# Patient Record
Sex: Male | Born: 1960 | Race: White | Hispanic: No | Marital: Married | Smoking: Never smoker
Health system: Southern US, Community
[De-identification: ages and names within clinical notes are randomized; demographics above are authoritative.]

## PROBLEM LIST (undated history)

## (undated) DIAGNOSIS — I10 Essential (primary) hypertension: Secondary | ICD-10-CM

## (undated) DIAGNOSIS — E119 Type 2 diabetes mellitus without complications: Secondary | ICD-10-CM

## (undated) DIAGNOSIS — Z87442 Personal history of urinary calculi: Secondary | ICD-10-CM

## (undated) DIAGNOSIS — M1990 Unspecified osteoarthritis, unspecified site: Secondary | ICD-10-CM

## (undated) HISTORY — PX: EYE SURGERY: SHX253

---

## 2015-04-28 ENCOUNTER — Other Ambulatory Visit (HOSPITAL_COMMUNITY): Payer: Self-pay | Admitting: Orthopaedic Surgery

## 2015-04-30 NOTE — Pre-Procedure Instructions (Addendum)
Rena Deanda  04/30/2015    Your procedure is scheduled on February 9.  Report to [facility] Admitting at 7:15 A.M.  Call this number if you have problems the morning of surgery:  [phone]   Remember:  Do not eat food or drink liquids after midnight.  Take these medicines the morning of surgery with A SIP OF WATER None    STOP Aspirin, Vitamin D, Fish Oil February 2   STOP/ Do not take Aspirin, Aleve, Naproxen, Advil, Ibuprofen, Motrin, Vitamins, Herbs, or Supplements starting February 2  How to Manage Your Diabetes Before Surgery   Why is it important to control my blood sugar before and after surgery?   Improving blood sugar levels before and after surgery helps healing and can limit problems.  A way of improving blood sugar control is eating a healthy diet by:  - Eating less sugar and carbohydrates  - Increasing activity/exercise  - Talk with your doctor about reaching your blood sugar goals  High blood sugars (greater than 180 mg/dL) can raise your risk of infections and slow down your recovery so you will need to focus on controlling your diabetes during the weeks before surgery.  Make sure that the doctor who takes care of your diabetes knows about your planned surgery including the date and location.  How do I manage my blood sugars before surgery?   Check your blood sugar at least 4 times a day, 2 days before surgery to make sure that they are not too high or low.   Check your blood sugar the morning of your surgery when you wake up and every 2 hours until you get to the Short-Stay unit.  If your blood sugar is less than 70 mg/dL, you will need to treat for low blood sugar by:  Treat a low blood sugar (less than 70 mg/dL) with 1/2 cup of clear juice (cranberry or apple), 4 glucose tablets, OR glucose gel.  Recheck blood sugar in 15 minutes after treatment (to make sure it is greater than 70 mg/dL).  If blood sugar is not greater than 70 mg/dL  on re-check, call [phone] for further instructions.   Report your blood sugar to the Short-Stay nurse when you get to Short-Stay.   What do I do about my diabetes medications?   Do not take oral diabetes medicines (pills) the morning of surgery.(glipizide)janumet)    Do not wear jewelry, make-up or nail polish.  Do not wear lotions, powders, or perfumes.  You may wear deodorant.  Do not shave 48 hours prior to surgery.  Men may shave face and neck.  Do not bring valuables to the hospital.  [facility] is not responsible for any belongings or valuables.  Contacts, dentures or bridgework may not be worn into surgery.  Leave your suitcase in the car.  After surgery it may be brought to your room.  For patients admitted to the hospital, discharge time will be determined by your treatment team.  Patients discharged the day of surgery will not be allowed to drive home.   [place] - Preparing for Surgery  Before surgery, you can play an important role.  Because skin is not sterile, your skin needs to be as free of germs as possible.  You can reduce the number of germs on you skin by washing with CHG (chlorahexidine gluconate) soap before surgery.  CHG is an antiseptic cleaner which kills germs and bonds with the skin to continue killing  germs even after washing.  Please DO NOT use if you have an allergy to CHG or antibacterial soaps.  If your skin becomes reddened/irritated stop using the CHG and inform your nurse when you arrive at Short Stay.  Do not shave (including legs and underarms) for at least 48 hours prior to the first CHG shower.  You may shave your face.  Please follow these instructions carefully:   1.  Shower with CHG Soap the night before surgery and the morning of Surgery.  2.  If you choose to wash your hair, wash your hair first as usual with your normal shampoo.  3.  After you shampoo, rinse your hair and body thoroughly to remove the shampoo.  4.  Use CHG as  you would any other liquid soap.  You can apply CHG directly to the skin and wash gently with scrungie or a clean washcloth.  5.  Apply the CHG Soap to your body ONLY FROM THE NECK DOWN.  Do not use on open wounds or open sores.  Avoid contact with your eyes, ears, mouth and genitals (private parts).  Wash genitals (private parts) with your normal soap.  6.  Wash thoroughly, paying special attention to the area where your surgery will be performed.  7.  Thoroughly rinse your body with warm water from the neck down.  8.  DO NOT shower/wash with your normal soap after using and rinsing off the CHG Soap.  9.  Pat yourself dry with a clean towel.            10.  Wear clean pajamas.            11.  Place clean sheets on your bed the night of your first shower and do not sleep with pets.  Day of Surgery  Do not apply any lotions the morning of surgery.  Please wear clean clothes to the hospital/surgery center.   Please read over the following fact sheets that you were given. Pain Booklet, Coughing and Deep Breathing, Total Joint Packet and Surgical Site Infection Prevention

## 2015-05-02 ENCOUNTER — Encounter (HOSPITAL_COMMUNITY)
Admission: RE | Admit: 2015-05-02 | Discharge: 2015-05-02 | Disposition: A | Discharge status: Home / Self Care | Payer: Managed Care, Other (non HMO) | Source: Ambulatory Visit | Attending: Orthopaedic Surgery | Admitting: Orthopaedic Surgery

## 2015-05-02 ENCOUNTER — Encounter (HOSPITAL_COMMUNITY): Payer: Self-pay

## 2015-05-02 DIAGNOSIS — E119 Type 2 diabetes mellitus without complications: Secondary | ICD-10-CM | POA: Diagnosis not present

## 2015-05-02 DIAGNOSIS — I10 Essential (primary) hypertension: Secondary | ICD-10-CM | POA: Diagnosis not present

## 2015-05-02 DIAGNOSIS — Z7984 Long term (current) use of oral hypoglycemic drugs: Secondary | ICD-10-CM | POA: Insufficient documentation

## 2015-05-02 DIAGNOSIS — M1612 Unilateral primary osteoarthritis, left hip: Secondary | ICD-10-CM | POA: Diagnosis not present

## 2015-05-02 DIAGNOSIS — Z01812 Encounter for preprocedural laboratory examination: Secondary | ICD-10-CM | POA: Diagnosis not present

## 2015-05-02 DIAGNOSIS — I4510 Unspecified right bundle-branch block: Secondary | ICD-10-CM | POA: Insufficient documentation

## 2015-05-02 DIAGNOSIS — Z01818 Encounter for other preprocedural examination: Secondary | ICD-10-CM | POA: Insufficient documentation

## 2015-05-02 DIAGNOSIS — Z79899 Other long term (current) drug therapy: Secondary | ICD-10-CM | POA: Insufficient documentation

## 2015-05-02 HISTORY — DX: Essential (primary) hypertension: I10

## 2015-05-02 HISTORY — DX: Type 2 diabetes mellitus without complications: E11.9

## 2015-05-02 HISTORY — DX: Personal history of urinary calculi: Z87.442

## 2015-05-02 LAB — BASIC METABOLIC PANEL
ANION GAP: 10 (ref 5–15)
BUN: 18 mg/dL (ref 6–20)
CALCIUM: 9.3 mg/dL (ref 8.9–10.3)
CHLORIDE: 105 mmol/L (ref 101–111)
CO2: 24 mmol/L (ref 22–32)
CREATININE: 1.01 mg/dL (ref 0.61–1.24)
GFR calc Af Amer: >60 mL/min (ref >60)
GFR calc non Af Amer: >60 mL/min (ref >60)
GLUCOSE: 180 mg/dL — AB (ref 65–99)
Potassium: 4.3 mmol/L (ref 3.5–5.1)
Sodium: 139 mmol/L (ref 135–145)

## 2015-05-02 LAB — CBC
HCT: 44.9 % (ref 39.0–52.0)
HEMOGLOBIN: 16.1 g/dL (ref 13.0–17.0)
MCH: 31.1 pg (ref 26.0–34.0)
MCHC: 35.9 g/dL (ref 30.0–36.0)
MCV: 86.8 fL (ref 78.0–100.0)
Platelets: 172 10*3/uL (ref 150–400)
RBC: 5.17 MIL/uL (ref 4.22–5.81)
RDW: 12.7 % (ref 11.5–15.5)
WBC: 7.2 10*3/uL (ref 4.0–10.5)

## 2015-05-02 LAB — SURGICAL PCR SCREEN
MRSA, PCR: NEGATIVE
Staphylococcus aureus: NEGATIVE

## 2015-05-02 LAB — GLUCOSE, CAPILLARY: GLUCOSE-CAPILLARY: 160 mg/dL — AB (ref 65–99)

## 2015-05-03 LAB — HEMOGLOBIN A1C
HEMOGLOBIN A1C: 7 % — AB (ref 4.8–5.6)
Mean Plasma Glucose: 154 mg/dL

## 2015-05-04 NOTE — Progress Notes (Signed)
Anesthesia Chart Review:  Pt is a 55 year old male scheduled for L total hip arthroplasty anterior approach on 05/12/2015 with Dr. Maureen Ralphs.   PCP is Dr. Lamount Cranker.  PMH includes:  HTN, DM, heart murmur. Never smoker. BMI 24  Medications include: ASA, glipizide, janumet, lisinopril-hctz, potassium.   Preoperative labs reviewed.  Glucose 180, hgbA1c 7.0  EKG 05/02/15: NSR. Incomplete RBBB. Minimal voltage criteria for LVH, may be normal variant. Possible Anterior infarct, age undetermined. No old tracing available for comparison  Reviewed case with Dr. Maple Hudson.   If no changes, I anticipate pt can proceed with surgery as scheduled.   Rica Mast, FNP-BC [facility]/Anesthesiology Phone: [phone] 05/04/2015 12:23 PM

## 2015-05-11 MED ORDER — TRANEXAMIC ACID 1000 MG/10ML IV SOLN
1000.0000 mg | INTRAVENOUS | Status: AC
  Administered 2015-05-12: 1000 mg via INTRAVENOUS
  Filled 2015-05-11: qty 10

## 2015-05-11 MED ORDER — CEFAZOLIN SODIUM-DEXTROSE 2-3 GM-% IV SOLR
2.0000 g | INTRAVENOUS | Status: AC
  Administered 2015-05-12: 2 g via INTRAVENOUS
  Filled 2015-05-11: qty 50

## 2015-05-12 ENCOUNTER — Inpatient Hospital Stay (HOSPITAL_COMMUNITY): Payer: Managed Care, Other (non HMO)

## 2015-05-12 ENCOUNTER — Encounter (HOSPITAL_COMMUNITY)
Admission: RE | Disposition: A | Discharge status: Home Health | Payer: Self-pay | Source: Ambulatory Visit | Attending: Orthopaedic Surgery

## 2015-05-12 ENCOUNTER — Inpatient Hospital Stay (HOSPITAL_COMMUNITY): Payer: Managed Care, Other (non HMO) | Admitting: Emergency Medicine

## 2015-05-12 ENCOUNTER — Encounter (HOSPITAL_COMMUNITY): Payer: Self-pay | Admitting: General Practice

## 2015-05-12 ENCOUNTER — Inpatient Hospital Stay (HOSPITAL_COMMUNITY)
Admission: RE | Admit: 2015-05-12 | Discharge: 2015-05-14 | DRG: 470 | Disposition: A | Discharge status: Home Health | Payer: Managed Care, Other (non HMO) | Source: Ambulatory Visit | Attending: Orthopaedic Surgery | Admitting: Orthopaedic Surgery

## 2015-05-12 ENCOUNTER — Inpatient Hospital Stay (HOSPITAL_COMMUNITY): Payer: Managed Care, Other (non HMO) | Admitting: Certified Registered"

## 2015-05-12 DIAGNOSIS — Z7984 Long term (current) use of oral hypoglycemic drugs: Secondary | ICD-10-CM | POA: Diagnosis not present

## 2015-05-12 DIAGNOSIS — E119 Type 2 diabetes mellitus without complications: Secondary | ICD-10-CM | POA: Diagnosis present

## 2015-05-12 DIAGNOSIS — Z419 Encounter for procedure for purposes other than remedying health state, unspecified: Secondary | ICD-10-CM

## 2015-05-12 DIAGNOSIS — Z791 Long term (current) use of non-steroidal anti-inflammatories (NSAID): Secondary | ICD-10-CM

## 2015-05-12 DIAGNOSIS — Z96642 Presence of left artificial hip joint: Secondary | ICD-10-CM

## 2015-05-12 DIAGNOSIS — I10 Essential (primary) hypertension: Secondary | ICD-10-CM | POA: Diagnosis present

## 2015-05-12 DIAGNOSIS — M25552 Pain in left hip: Secondary | ICD-10-CM | POA: Diagnosis present

## 2015-05-12 DIAGNOSIS — M1612 Unilateral primary osteoarthritis, left hip: Secondary | ICD-10-CM | POA: Diagnosis present

## 2015-05-12 HISTORY — DX: Unspecified osteoarthritis, unspecified site: M19.90

## 2015-05-12 HISTORY — PX: TOTAL HIP ARTHROPLASTY: SHX124

## 2015-05-12 LAB — GLUCOSE, CAPILLARY
Glucose-Capillary: 150 mg/dL — ABNORMAL HIGH (ref 65–99)
Glucose-Capillary: 101 mg/dL — ABNORMAL HIGH (ref 65–99)
Glucose-Capillary: 162 mg/dL — ABNORMAL HIGH (ref 65–99)
Glucose-Capillary: 173 mg/dL — ABNORMAL HIGH (ref 65–99)
Glucose-Capillary: 268 mg/dL — ABNORMAL HIGH (ref 65–99)

## 2015-05-12 SURGERY — ARTHROPLASTY, HIP, TOTAL, ANTERIOR APPROACH
Anesthesia: Monitor Anesthesia Care | Site: Hip | Laterality: Left

## 2015-05-12 MED ORDER — POTASSIUM CHLORIDE CRYS ER 20 MEQ PO TBCR
20.0000 meq | EXTENDED_RELEASE_TABLET | Freq: Two times a day (BID) | ORAL | Status: DC
Start: 2015-05-12 — End: 2015-05-14
  Administered 2015-05-12 – 2015-05-14 (×4): 20 meq via ORAL
  Filled 2015-05-12 (×5): qty 1

## 2015-05-12 MED ORDER — METHOCARBAMOL 1000 MG/10ML IJ SOLN
500.0000 mg | Freq: Four times a day (QID) | INTRAVENOUS | Status: DC | PRN
  Filled 2015-05-12: qty 5

## 2015-05-12 MED ORDER — LIDOCAINE HCL (CARDIAC) 20 MG/ML IV SOLN
INTRAVENOUS | Status: AC
  Filled 2015-05-12: qty 5

## 2015-05-12 MED ORDER — BUPIVACAINE IN DEXTROSE 0.75-8.25 % IT SOLN
INTRATHECAL | Status: DC | PRN
  Administered 2015-05-12: 13.5 mg via INTRATHECAL

## 2015-05-12 MED ORDER — ASPIRIN EC 325 MG PO TBEC
325.0000 mg | DELAYED_RELEASE_TABLET | Freq: Two times a day (BID) | ORAL | Status: DC
  Administered 2015-05-12 – 2015-05-14 (×4): 325 mg via ORAL
  Filled 2015-05-12 (×4): qty 1

## 2015-05-12 MED ORDER — METOCLOPRAMIDE HCL 5 MG PO TABS: 5.0000 mg | ORAL_TABLET | Freq: Three times a day (TID) | ORAL | Status: DC | PRN

## 2015-05-12 MED ORDER — METFORMIN HCL 500 MG PO TABS
1000.0000 mg | ORAL_TABLET | Freq: Two times a day (BID) | ORAL | Status: DC
  Administered 2015-05-12 – 2015-05-14 (×4): 1000 mg via ORAL
  Filled 2015-05-12 (×4): qty 2

## 2015-05-12 MED ORDER — HYDROMORPHONE HCL 1 MG/ML IJ SOLN
0.2500 mg | INTRAMUSCULAR | Status: DC | PRN
  Administered 2015-05-12: 0.25 mg via INTRAVENOUS

## 2015-05-12 MED ORDER — PROPOFOL 10 MG/ML IV BOLUS
INTRAVENOUS | Status: AC
  Filled 2015-05-12: qty 20

## 2015-05-12 MED ORDER — DOCUSATE SODIUM 100 MG PO CAPS
100.0000 mg | ORAL_CAPSULE | Freq: Two times a day (BID) | ORAL | Status: DC
Start: 2015-05-12 — End: 2015-05-14
  Administered 2015-05-12 – 2015-05-14 (×5): 100 mg via ORAL
  Filled 2015-05-12 (×5): qty 1

## 2015-05-12 MED ORDER — MIDAZOLAM HCL 2 MG/2ML IJ SOLN
INTRAMUSCULAR | Status: AC
  Filled 2015-05-12: qty 2

## 2015-05-12 MED ORDER — OXYCODONE HCL 5 MG PO TABS
5.0000 mg | ORAL_TABLET | ORAL | Status: DC | PRN
  Administered 2015-05-12 – 2015-05-14 (×10): 10 mg via ORAL
  Filled 2015-05-12 (×10): qty 2

## 2015-05-12 MED ORDER — ONDANSETRON HCL 4 MG/2ML IJ SOLN: 4.0000 mg | Freq: Four times a day (QID) | INTRAMUSCULAR | Status: DC | PRN

## 2015-05-12 MED ORDER — 0.9 % SODIUM CHLORIDE (POUR BTL) OPTIME
TOPICAL | Status: DC | PRN
  Administered 2015-05-12: 1000 mL

## 2015-05-12 MED ORDER — PHENYLEPHRINE HCL 10 MG/ML IJ SOLN
INTRAMUSCULAR | Status: DC | PRN
  Administered 2015-05-12: 40 ug via INTRAVENOUS
  Administered 2015-05-12: 80 ug via INTRAVENOUS

## 2015-05-12 MED ORDER — ALUM & MAG HYDROXIDE-SIMETH 200-200-20 MG/5ML PO SUSP: 30.0000 mL | ORAL | Status: DC | PRN

## 2015-05-12 MED ORDER — FENTANYL CITRATE (PF) 250 MCG/5ML IJ SOLN
INTRAMUSCULAR | Status: DC | PRN
  Administered 2015-05-12: 50 ug via INTRAVENOUS

## 2015-05-12 MED ORDER — SODIUM CHLORIDE 0.9 % IR SOLN
Status: DC | PRN
Start: 2015-05-12 — End: 2015-05-12
  Administered 2015-05-12: 1000 mL

## 2015-05-12 MED ORDER — OXYCODONE HCL 5 MG PO TABS
ORAL_TABLET | ORAL | Status: AC
  Filled 2015-05-12: qty 2

## 2015-05-12 MED ORDER — ONDANSETRON HCL 4 MG PO TABS: 4.0000 mg | ORAL_TABLET | Freq: Four times a day (QID) | ORAL | Status: DC | PRN

## 2015-05-12 MED ORDER — CEFAZOLIN SODIUM 1-5 GM-% IV SOLN
1.0000 g | Freq: Four times a day (QID) | INTRAVENOUS | Status: AC
  Administered 2015-05-12 (×2): 1 g via INTRAVENOUS
  Filled 2015-05-12 (×2): qty 50

## 2015-05-12 MED ORDER — METOCLOPRAMIDE HCL 5 MG/ML IJ SOLN: 5.0000 mg | Freq: Three times a day (TID) | INTRAMUSCULAR | Status: DC | PRN

## 2015-05-12 MED ORDER — GLIPIZIDE ER 5 MG PO TB24
5.0000 mg | ORAL_TABLET | Freq: Every day | ORAL | Status: DC
  Administered 2015-05-13 – 2015-05-14 (×2): 5 mg via ORAL
  Filled 2015-05-12 (×3): qty 1

## 2015-05-12 MED ORDER — ONDANSETRON HCL 4 MG/2ML IJ SOLN
INTRAMUSCULAR | Status: AC
  Filled 2015-05-12: qty 2

## 2015-05-12 MED ORDER — HYDROMORPHONE HCL 1 MG/ML IJ SOLN
INTRAMUSCULAR | Status: AC
  Administered 2015-05-12: 0.25 mg via INTRAVENOUS
  Filled 2015-05-12: qty 1

## 2015-05-12 MED ORDER — PROPOFOL 500 MG/50ML IV EMUL
INTRAVENOUS | Status: DC | PRN
  Administered 2015-05-12: 100 ug/kg/min via INTRAVENOUS

## 2015-05-12 MED ORDER — VITAMIN D 1000 UNITS PO TABS
1000.0000 [IU] | ORAL_TABLET | Freq: Every day | ORAL | Status: DC
  Administered 2015-05-12 – 2015-05-14 (×3): 1000 [IU] via ORAL
  Filled 2015-05-12 (×3): qty 1

## 2015-05-12 MED ORDER — ZOLPIDEM TARTRATE 5 MG PO TABS: 5.0000 mg | ORAL_TABLET | Freq: Every evening | ORAL | Status: DC | PRN

## 2015-05-12 MED ORDER — INSULIN ASPART 100 UNIT/ML ~~LOC~~ SOLN: 0.0000 [IU] | Freq: Every day | SUBCUTANEOUS | Status: DC

## 2015-05-12 MED ORDER — METHOCARBAMOL 500 MG PO TABS
500.0000 mg | ORAL_TABLET | Freq: Four times a day (QID) | ORAL | Status: DC | PRN
  Administered 2015-05-12 – 2015-05-14 (×5): 500 mg via ORAL
  Filled 2015-05-12 (×5): qty 1

## 2015-05-12 MED ORDER — SITAGLIPTIN PHOS-METFORMIN HCL 50-1000 MG PO TABS: 1.0000 | ORAL_TABLET | Freq: Two times a day (BID) | ORAL | Status: DC

## 2015-05-12 MED ORDER — INSULIN ASPART 100 UNIT/ML ~~LOC~~ SOLN
0.0000 [IU] | Freq: Three times a day (TID) | SUBCUTANEOUS | Status: DC
Start: 2015-05-12 — End: 2015-05-14
  Administered 2015-05-13: 3 [IU] via SUBCUTANEOUS
  Administered 2015-05-13: 11 [IU] via SUBCUTANEOUS
  Administered 2015-05-14: 2 [IU] via SUBCUTANEOUS

## 2015-05-12 MED ORDER — LACTATED RINGERS IV SOLN
INTRAVENOUS | Status: DC
  Administered 2015-05-12 (×2): via INTRAVENOUS

## 2015-05-12 MED ORDER — SITAGLIPTIN PHOSPHATE 50 MG PO TABS
50.0000 mg | ORAL_TABLET | Freq: Two times a day (BID) | ORAL | Status: DC
  Administered 2015-05-12 – 2015-05-14 (×4): 50 mg via ORAL
  Filled 2015-05-12 (×6): qty 1

## 2015-05-12 MED ORDER — DIPHENHYDRAMINE HCL 12.5 MG/5ML PO ELIX: 12.5000 mg | ORAL_SOLUTION | ORAL | Status: DC | PRN

## 2015-05-12 MED ORDER — PHENOL 1.4 % MT LIQD: 1.0000 | OROMUCOSAL | Status: DC | PRN

## 2015-05-12 MED ORDER — MIDAZOLAM HCL 5 MG/5ML IJ SOLN
INTRAMUSCULAR | Status: DC | PRN
  Administered 2015-05-12 (×2): 1 mg via INTRAVENOUS

## 2015-05-12 MED ORDER — HYDROMORPHONE HCL 1 MG/ML IJ SOLN
1.0000 mg | INTRAMUSCULAR | Status: DC | PRN
  Administered 2015-05-12 – 2015-05-13 (×2): 1 mg via INTRAVENOUS
  Filled 2015-05-12 (×2): qty 1

## 2015-05-12 MED ORDER — ONDANSETRON HCL 4 MG/2ML IJ SOLN
INTRAMUSCULAR | Status: DC | PRN
  Administered 2015-05-12: 4 mg via INTRAVENOUS

## 2015-05-12 MED ORDER — FENTANYL CITRATE (PF) 250 MCG/5ML IJ SOLN
INTRAMUSCULAR | Status: AC
  Filled 2015-05-12: qty 5

## 2015-05-12 MED ORDER — ACETAMINOPHEN 325 MG PO TABS
650.0000 mg | ORAL_TABLET | Freq: Four times a day (QID) | ORAL | Status: DC | PRN
Start: 2015-05-12 — End: 2015-05-14

## 2015-05-12 MED ORDER — POLYETHYLENE GLYCOL 3350 17 G PO PACK: 17.0000 g | PACK | Freq: Every day | ORAL | Status: DC | PRN

## 2015-05-12 MED ORDER — SODIUM CHLORIDE 0.9 % IV SOLN
INTRAVENOUS | Status: DC
  Administered 2015-05-12: 75 mL/h via INTRAVENOUS

## 2015-05-12 MED ORDER — ACETAMINOPHEN 650 MG RE SUPP: 650.0000 mg | Freq: Four times a day (QID) | RECTAL | Status: DC | PRN

## 2015-05-12 MED ORDER — MENTHOL 3 MG MT LOZG: 1.0000 | LOZENGE | OROMUCOSAL | Status: DC | PRN

## 2015-05-12 SURGICAL SUPPLY — 49 items
BENZOIN TINCTURE PRP APPL 2/3 (GAUZE/BANDAGES/DRESSINGS) ×3 IMPLANT
BLADE SAW SGTL 18X1.27X75 (BLADE) ×2 IMPLANT
BLADE SAW SGTL 18X1.27X75MM (BLADE) ×1
BLADE SURG ROTATE 9660 (MISCELLANEOUS) IMPLANT
CAPT HIP TOTAL 2 ×3 IMPLANT
CELLS DAT CNTRL 66122 CELL SVR (MISCELLANEOUS) ×1 IMPLANT
CLOSURE WOUND 1/2 X4 (GAUZE/BANDAGES/DRESSINGS) ×1
COVER SURGICAL LIGHT HANDLE (MISCELLANEOUS) ×3 IMPLANT
DRAPE C-ARM 42X72 X-RAY (DRAPES) ×3 IMPLANT
DRAPE STERI IOBAN 125X83 (DRAPES) ×3 IMPLANT
DRAPE U-SHAPE 47X51 STRL (DRAPES) ×9 IMPLANT
DRSG AQUACEL AG ADV 3.5X10 (GAUZE/BANDAGES/DRESSINGS) ×3 IMPLANT
DURAPREP 26ML APPLICATOR (WOUND CARE) ×3 IMPLANT
ELECT BLADE 4.0 EZ CLEAN MEGAD (MISCELLANEOUS) ×3
ELECT BLADE 6.5 EXT (BLADE) IMPLANT
ELECT REM PT RETURN 9FT ADLT (ELECTROSURGICAL) ×3
ELECTRODE BLDE 4.0 EZ CLN MEGD (MISCELLANEOUS) ×1 IMPLANT
ELECTRODE REM PT RTRN 9FT ADLT (ELECTROSURGICAL) ×1 IMPLANT
FACESHIELD WRAPAROUND (MASK) ×9 IMPLANT
GLOVE BIOGEL PI IND STRL 8 (GLOVE) ×2 IMPLANT
GLOVE BIOGEL PI INDICATOR 8 (GLOVE) ×4
GLOVE ECLIPSE 8.0 STRL XLNG CF (GLOVE) ×3 IMPLANT
GLOVE ORTHO TXT STRL SZ7.5 (GLOVE) ×3 IMPLANT
GOWN STRL REUS W/ TWL LRG LVL3 (GOWN DISPOSABLE) ×2 IMPLANT
GOWN STRL REUS W/ TWL XL LVL3 (GOWN DISPOSABLE) ×4 IMPLANT
GOWN STRL REUS W/TWL LRG LVL3 (GOWN DISPOSABLE) ×4
GOWN STRL REUS W/TWL XL LVL3 (GOWN DISPOSABLE) ×8
HANDPIECE INTERPULSE COAX TIP (DISPOSABLE) ×2
KIT BASIN OR (CUSTOM PROCEDURE TRAY) ×3 IMPLANT
KIT ROOM TURNOVER OR (KITS) ×3 IMPLANT
MANIFOLD NEPTUNE II (INSTRUMENTS) ×3 IMPLANT
NS IRRIG 1000ML POUR BTL (IV SOLUTION) ×3 IMPLANT
PACK TOTAL JOINT (CUSTOM PROCEDURE TRAY) ×3 IMPLANT
PAD ARMBOARD 7.5X6 YLW CONV (MISCELLANEOUS) ×3 IMPLANT
RTRCTR WOUND ALEXIS 18CM MED (MISCELLANEOUS) ×3
SET HNDPC FAN SPRY TIP SCT (DISPOSABLE) ×1 IMPLANT
STAPLER VISISTAT 35W (STAPLE) ×3 IMPLANT
STRIP CLOSURE SKIN 1/2X4 (GAUZE/BANDAGES/DRESSINGS) ×2 IMPLANT
SUT ETHIBOND NAB CT1 #1 30IN (SUTURE) ×3 IMPLANT
SUT MNCRL AB 4-0 PS2 18 (SUTURE) ×3 IMPLANT
SUT VIC AB 0 CT1 27 (SUTURE) ×4
SUT VIC AB 0 CT1 27XBRD ANBCTR (SUTURE) ×2 IMPLANT
SUT VIC AB 1 CT1 27 (SUTURE) ×4
SUT VIC AB 1 CT1 27XBRD ANBCTR (SUTURE) ×2 IMPLANT
SUT VIC AB 2-0 CT1 27 (SUTURE) ×4
SUT VIC AB 2-0 CT1 TAPERPNT 27 (SUTURE) ×2 IMPLANT
TOWEL OR 17X24 6PK STRL BLUE (TOWEL DISPOSABLE) ×3 IMPLANT
TOWEL OR 17X26 10 PK STRL BLUE (TOWEL DISPOSABLE) ×3 IMPLANT
TRAY CATH 16FR W/PLASTIC CATH (SET/KITS/TRAYS/PACK) ×3 IMPLANT

## 2015-05-12 NOTE — Anesthesia Postprocedure Evaluation (Signed)
Anesthesia Post Note  Patient: Glenn Stevenson  Procedure(s) Performed: Procedure(s) (LRB): LEFT TOTAL HIP ARTHROPLASTY ANTERIOR APPROACH (Left)  Patient location during evaluation: PACU Anesthesia Type: Spinal and MAC Level of consciousness: awake and alert Pain management: pain level controlled Vital Signs Assessment: post-procedure vital signs reviewed and stable Respiratory status: spontaneous breathing and respiratory function stable Cardiovascular status: blood pressure returned to baseline and stable Postop Assessment: spinal receding Anesthetic complications: no    Last Vitals:  Filed Vitals:   05/12/15 1310 05/12/15 1325  BP: 109/77 106/74  Pulse: 53 55  Temp:    Resp: 10 12    Last Pain:  Filed Vitals:   05/12/15 1338  PainSc: 6                  Deiona Hooper,W. EDMOND

## 2015-05-12 NOTE — Evaluation (Signed)
Physical Therapy Evaluation Patient Details Name: Glenn Stevenson MRN: [id number] DOB: [birth year] Today's Date: 05/12/2015   History of Present Illness  Patient is a 55 yo male admitted 05/12/15 now s/p Lt THA - direct anterior approach with no precautions.   PMH:  OA, HTN, DM  Clinical Impression  Patient presents with problems listed below.  Will benefit from acute PT to maximize functional mobility prior to discharge home with wife.    Follow Up Recommendations Home health PT;Supervision for mobility/OOB    Equipment Recommendations  Rolling walker with 5" wheels;3in1 (PT)    Recommendations for Other Services       Precautions / Restrictions Precautions Precautions: None Restrictions Weight Bearing Restrictions: Yes LLE Weight Bearing: Weight bearing as tolerated      Mobility  Bed Mobility Overal bed mobility: Needs Assistance Bed Mobility: Supine to Sit     Supine to sit: Min guard     General bed mobility comments: Verbal cues for technique.  Patient using bed rail, able to move to sitting with no physical assist.  Good balance in sitting.  Reports minimal dizziness in sitting - decreased with time.  Transfers Overall transfer level: Needs assistance Equipment used: Rolling walker (2 wheeled) Transfers: Sit to/from [employer] Sit to Stand: Min assist Stand pivot transfers: Min assist       General transfer comment: Verbal cues for hand placement and technique.  Assist to steady to move to standing.  Patient able to take several shuffle steps to pivot to chair.  Ambulation/Gait                Stairs            Wheelchair Mobility    Modified Rankin (Stroke Patients Only)       Balance                                             Pertinent Vitals/Pain Pain Assessment: 0-10 Pain Score: 5  Pain Location: Lt hip Pain Descriptors / Indicators: Aching;Sore Pain Intervention(s): Limited activity within  patient's tolerance;Monitored during session;Premedicated before session;Repositioned    Home Living Family/patient expects to be discharged to:: Private residence Living Arrangements: Spouse/significant other Available Help at Discharge: Family;Available 24 hours/day Type of Home: House Home Access: Stairs to enter Entrance Stairs-Rails: None Entrance Stairs-Number of Steps: 2 Home Layout: One level;Laundry or work area in [employer]: Crutches      Prior Function Level of Independence: Independent         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        Extremity/Trunk Assessment   Upper Extremity Assessment: Overall WFL for tasks assessed           Lower Extremity Assessment: LLE deficits/detail   LLE Deficits / Details: decreased strength and ROM as expected post-op  Cervical / Trunk Assessment: Normal  Communication   Communication: No difficulties  Cognition Arousal/Alertness: Awake/alert Behavior During Therapy: WFL for tasks assessed/performed Overall Cognitive Status: Within Functional Limits for tasks assessed                      General Comments      Exercises Total Joint Exercises Ankle Circles/Pumps: AROM;Both;10 reps;Supine      Assessment/Plan    PT Assessment Patient needs continued PT services  PT Diagnosis  Difficulty walking;Abnormality of gait;Acute pain   PT Problem List Decreased strength;Decreased range of motion;Decreased activity tolerance;Decreased balance;Decreased mobility;Decreased knowledge of use of DME;Decreased knowledge of precautions;Pain  PT Treatment Interventions DME instruction;Gait training;Stair training;Functional mobility training;Therapeutic activities;Therapeutic exercise;Patient/family education   PT Goals (Current goals can be found in the Care Plan section) Acute Rehab PT Goals Patient Stated Goal: To return home PT Goal Formulation: With patient/family Time For Goal  Achievement: 05/19/15 Potential to Achieve Goals: Good    Frequency 7X/week   Barriers to discharge        Co-evaluation               End of Session Equipment Utilized During Treatment: Gait belt Activity Tolerance: Patient tolerated treatment well Patient left: in chair;with call bell/phone within reach;with family/visitor present Nurse Communication: Mobility status         Time: 4098-1191 PT Time Calculation (min) (ACUTE ONLY): 29 min   Charges:   PT Evaluation $PT Eval Moderate Complexity: 1 Procedure PT Treatments $Therapeutic Activity: 8-22 mins   PT G Codes:        Vena Austria 06-03-15, 3:15 PM Durenda Hurt. Renaldo Fiddler, [facility] Acute Rehab Services Pager [phone]

## 2015-05-12 NOTE — Brief Op Note (Signed)
05/12/2015  11:17 AM  PATIENT:  Glenn Stevenson  55 y.o. male  PRE-OPERATIVE DIAGNOSIS:  osteoarthritis left hip  POST-OPERATIVE DIAGNOSIS:  osteoarthritis left hip  PROCEDURE:  Procedure(s): LEFT TOTAL HIP ARTHROPLASTY ANTERIOR APPROACH (Left)  SURGEON:  Surgeon(s) and Role:    * Kathryne Hitch, MD - Primary  PHYSICIAN ASSISTANT: Rexene Edison, PA-C  ANESTHESIA:   general  EBL:  Total I/O In: -  Out: 150 [Blood:150]  COUNTS:  YES  DICTATION: .Other Dictation: Dictation Number [id number]  PLAN OF CARE: Admit to inpatient   PATIENT DISPOSITION:  PACU - hemodynamically stable.   Delay start of Pharmacological VTE agent (>24hrs) due to surgical blood loss or risk of bleeding: no

## 2015-05-12 NOTE — Anesthesia Preprocedure Evaluation (Addendum)
Anesthesia Evaluation  Patient identified by MRN, date of birth, ID band Patient awake    Reviewed: Allergy & Precautions, H&P , NPO status , Patient's Chart, lab work & pertinent test results  Airway Mallampati: II  TM Distance: >3 FB Neck ROM: Full    Dental no notable dental hx. (+) Teeth Intact, Dental Advisory Given   Pulmonary neg pulmonary ROS,    Pulmonary exam normal breath sounds clear to auscultation       Cardiovascular hypertension, Pt. on medications  Rhythm:Regular Rate:Normal     Neuro/Psych negative neurological ROS  negative psych ROS   GI/Hepatic negative GI ROS, Neg liver ROS,   Endo/Other  diabetes, Type 2, Oral Hypoglycemic Agents  Renal/GU negative Renal ROS  negative genitourinary   Musculoskeletal  (+) Arthritis , Osteoarthritis,    Abdominal   Peds  Hematology negative hematology ROS (+)   Anesthesia Other Findings   Reproductive/Obstetrics negative OB ROS                            Anesthesia Physical Anesthesia Plan  ASA: II  Anesthesia Plan: Spinal and MAC   Post-op Pain Management:    Induction: Intravenous  Airway Management Planned: Simple Face Mask  Additional Equipment:   Intra-op Plan:   Post-operative Plan:   Informed Consent: I have reviewed the patients History and Physical, chart, labs and discussed the procedure including the risks, benefits and alternatives for the proposed anesthesia with the patient or authorized representative who has indicated his/her understanding and acceptance.   Dental advisory given  Plan Discussed with: CRNA  Anesthesia Plan Comments:        Anesthesia Quick Evaluation

## 2015-05-12 NOTE — Transfer of Care (Signed)
Immediate Anesthesia Transfer of Care Note  Patient: Glenn Stevenson  Procedure(s) Performed: Procedure(s): LEFT TOTAL HIP ARTHROPLASTY ANTERIOR APPROACH (Left)  Patient Location: PACU  Anesthesia Type:General  Level of Consciousness: awake, alert , oriented and patient cooperative  Airway & Oxygen Therapy: Patient Spontanous Breathing and Patient connected to nasal cannula oxygen  Post-op Assessment: Report given to RN, Post -op Vital signs reviewed and stable and Patient moving all extremities  Post vital signs: Reviewed and stable  Last Vitals:  Filed Vitals:   05/12/15 0847  BP: 128/89  Pulse: 75  Temp: 36.7 C  Resp: 20    Complications: No apparent anesthesia complications

## 2015-05-12 NOTE — Anesthesia Procedure Notes (Signed)
Spinal Patient location during procedure: OR Start time: 05/12/2015 9:50 AM End time: 05/12/2015 9:55 AM Staffing Anesthesiologist: Gaynelle Adu Performed by: anesthesiologist  Preanesthetic Checklist Completed: patient identified, surgical consent, pre-op evaluation, timeout performed, IV checked, risks and benefits discussed and monitors and equipment checked Spinal Block Patient position: sitting Prep: DuraPrep Patient monitoring: cardiac monitor, continuous pulse ox and blood pressure Approach: midline Location: L3-4 Injection technique: single-shot Needle Needle type: Pencan  Needle gauge: 24 G Needle length: 9 cm Assessment Sensory level: T6 Additional Notes Functioning IV was confirmed and monitors were applied. Sterile prep and drape, including hand hygiene and sterile gloves were used. The patient was positioned and the spine was prepped. The skin was anesthetized with lidocaine.  Free flow of clear CSF was obtained prior to injecting local anesthetic into the CSF.  The spinal needle aspirated freely following injection.  The needle was carefully withdrawn.  The patient tolerated the procedure well.

## 2015-05-12 NOTE — H&P (Signed)
TOTAL HIP ADMISSION H&P  Patient is admitted for left total hip arthroplasty.  Subjective:  Chief Complaint: left hip pain  HPI: Glenn Stevenson, 55 y.o. male, has a history of pain and functional disability in the left hip(s) due to arthritis and patient has failed non-surgical conservative treatments for greater than 12 weeks to include NSAID's and/or analgesics, flexibility and strengthening excercises, use of assistive devices, weight reduction as appropriate and activity modification.  Onset of symptoms was gradual starting 5 years ago with gradually worsening course since that time.The patient noted no past surgery on the left hip(s).  Patient currently rates pain in the left hip at 10 out of 10 with activity. Patient has night pain, worsening of pain with activity and weight bearing, pain that interfers with activities of daily living and pain with passive range of motion. Patient has evidence of subchondral cysts, subchondral sclerosis, periarticular osteophytes and joint space narrowing by imaging studies. This condition presents safety issues increasing the risk of falls.  There is no current active infection.  Patient Active Problem List   Diagnosis Date Noted  . Osteoarthritis of left hip 05/12/2015   Past Medical History  Diagnosis Date  . Hypertension   . Heart murmur   . Diabetes mellitus without complication (HCC)   . History of kidney stones     Past Surgical History  Procedure Laterality Date  . Eye surgery Bilateral     lasik    No prescriptions prior to admission   No Known Allergies  Social History  Substance Use Topics  . Smoking status: Never Smoker   . Smokeless tobacco: Not on file  . Alcohol Use: No    No family history on file.   Review of Systems  Musculoskeletal: Positive for joint pain.  All other systems reviewed and are negative.   Objective:  Physical Exam  Constitutional: He is oriented to person, place, and time. He appears  well-developed and well-nourished.  HENT:  Head: Normocephalic and atraumatic.  Eyes: EOM are normal. Pupils are equal, round, and reactive to light.  Neck: Normal range of motion. Neck supple.  Cardiovascular: Normal rate and regular rhythm.   Respiratory: Effort normal and breath sounds normal.  GI: Soft. Bowel sounds are normal.  Musculoskeletal:       Left hip: He exhibits decreased range of motion, decreased strength, tenderness and bony tenderness.  Neurological: He is alert and oriented to person, place, and time.  Skin: Skin is warm and dry.  Psychiatric: He has a normal mood and affect.    Vital signs in last 24 hours:    Labs:   There is no height or weight on file to calculate BMI.   Imaging Review Plain radiographs demonstrate severe degenerative joint disease of the left hip(s). The bone quality appears to be good for age and reported activity level.  Assessment/Plan:  End stage arthritis, left hip(s)  The patient history, physical examination, clinical judgement of the provider and imaging studies are consistent with end stage degenerative joint disease of the left hip(s) and total hip arthroplasty is deemed medically necessary. The treatment options including medical management, injection therapy, arthroscopy and arthroplasty were discussed at length. The risks and benefits of total hip arthroplasty were presented and reviewed. The risks due to aseptic loosening, infection, stiffness, dislocation/subluxation,  thromboembolic complications and other imponderables were discussed.  The patient acknowledged the explanation, agreed to proceed with the plan and consent was signed. Patient is being admitted for inpatient treatment for  surgery, pain control, PT, OT, prophylactic antibiotics, VTE prophylaxis, progressive ambulation and ADL's and discharge planning.The patient is planning to be discharged home with home health services

## 2015-05-12 NOTE — OR Nursing (Signed)
Pt.  In and out cathed at end of procedure.  See I&O flowsheet.

## 2015-05-12 NOTE — Progress Notes (Signed)
Orthopedic Tech Progress Note Patient Details:  Glenn Stevenson 22-Aug-1960 [id number]  Ortho Devices Ortho Device/Splint Location: applied ohf to bed Ortho Device/Splint Interventions: Ordered, Application   Jennye Moccasin 05/12/2015, 7:08 PM

## 2015-05-13 ENCOUNTER — Encounter (HOSPITAL_COMMUNITY): Payer: Self-pay | Admitting: Orthopaedic Surgery

## 2015-05-13 LAB — GLUCOSE, CAPILLARY
GLUCOSE-CAPILLARY: 180 mg/dL — AB (ref 65–99)
GLUCOSE-CAPILLARY: 165 mg/dL — AB (ref 65–99)
Glucose-Capillary: 318 mg/dL — ABNORMAL HIGH (ref 65–99)
Glucose-Capillary: 154 mg/dL — ABNORMAL HIGH (ref 65–99)

## 2015-05-13 LAB — BASIC METABOLIC PANEL
ANION GAP: 6 (ref 5–15)
BUN: 15 mg/dL (ref 6–20)
CALCIUM: 8.7 mg/dL — AB (ref 8.9–10.3)
CO2: 27 mmol/L (ref 22–32)
CREATININE: 0.97 mg/dL (ref 0.61–1.24)
Chloride: 106 mmol/L (ref 101–111)
GFR calc non Af Amer: >60 mL/min (ref >60)
Glucose, Bld: 199 mg/dL — ABNORMAL HIGH (ref 65–99)
Potassium: 4.3 mmol/L (ref 3.5–5.1)
Sodium: 139 mmol/L (ref 135–145)

## 2015-05-13 LAB — CBC
HEMATOCRIT: 37.6 % — AB (ref 39.0–52.0)
HEMOGLOBIN: 13.8 g/dL (ref 13.0–17.0)
MCH: 31.7 pg (ref 26.0–34.0)
MCHC: 36.7 g/dL — ABNORMAL HIGH (ref 30.0–36.0)
MCV: 86.2 fL (ref 78.0–100.0)
Platelets: 150 10*3/uL (ref 150–400)
RBC: 4.36 MIL/uL (ref 4.22–5.81)
RDW: 12.7 % (ref 11.5–15.5)
WBC: 9.3 10*3/uL (ref 4.0–10.5)

## 2015-05-13 MED ORDER — OXYCODONE-ACETAMINOPHEN 5-325 MG PO TABS: 1.0000 | ORAL_TABLET | ORAL | Status: AC | PRN

## 2015-05-13 MED ORDER — ASPIRIN 325 MG PO TBEC: 325.0000 mg | DELAYED_RELEASE_TABLET | Freq: Two times a day (BID) | ORAL | Status: AC

## 2015-05-13 MED ORDER — METHOCARBAMOL 500 MG PO TABS: 500.0000 mg | ORAL_TABLET | Freq: Four times a day (QID) | ORAL | Status: AC | PRN

## 2015-05-13 NOTE — Discharge Instructions (Signed)

## 2015-05-13 NOTE — Op Note (Signed)
NAMESEBASTIAN, Stevenson NO.:  [id number]  MEDICAL RECORD NO.:  [id number]  LOCATION:  [id number]                        FACILITY:  [facility]  PHYSICIAN:  Vanita Panda. Magnus Ivan, M.D.DATE OF BIRTH:  Mar 13, 1961  DATE OF PROCEDURE:  05/12/2015 DATE OF DISCHARGE:                              OPERATIVE REPORT   PREOPERATIVE DIAGNOSES:  Primary end-stage osteoarthritis and degenerative joint disease, left hip.  POSTOPERATIVE DIAGNOSES:  Primary end-stage osteoarthritis and degenerative joint disease, left hip.  PROCEDURE:  Left total hip arthroplasty through direct anterior approach.  IMPLANTS:  DePuy Sector Gription acetabular component, size 54, size 36+ 0 polyethylene liner, size 13 Corail femoral component with standard offset, size 36+ 1.5 ceramic hip ball.  SURGEON:  Vanita Panda. Magnus Ivan, M.D.  ASSISTANT:  Richardean Canal, PA-C.  ANESTHESIA:  Spinal.  BLOOD LOSS:  Less than 500 mL.  COMPLICATIONS:  None.  ANTIBIOTICS:  2 g of IV Ancef.  INDICATIONS:  [name] is a very pleasant 55 year old gentleman who has had over 5-year history of left hip pain that has been worsening especially over the last 6 months.  It detrimentally effected his quality of living, his activities of daily living and his mobility.  His pain is daily.  It wakes him up at night and he is walking with a significant limp as well.  He uses a cane with socks on and his leg is very stiff.  His hip has minimal internal and external rotation.  Due to the severity of his pain and x-rays correlate with severe osteoarthritis of his left hip, there are cystic changes in the femoral head, little bit of collapse and severe joint space narrowing and periarticular osteophytes.  He has tried and failed all forms of conservative treatment.  He understands the risks of acute blood loss anemia, nerve and vessel injury, fracture, infection, dislocation, DVT.  He understands our goals are decreased pain,  improved mobility, and overall improved quality of life.  PROCEDURE DESCRIPTION:  After informed consent was obtained, appropriate left hip was marked.  He was brought to the operating room and spinal anesthesia was obtained while he was on a stretcher.  He was then laid in a supine position.  Traction boots were placed on both of his feet. Next, he was placed supine on the Hana fracture table with the perineal post in place and both legs in inline skeletal traction devices, no traction applied.  His left operative hip was then prepped and draped with DuraPrep and sterile drapes.  Time-out was called.  He was identified as correct patient and correct left hip.  We then made an incision just inferior and posterior to the anterior superior iliac spine and carried this obliquely down the leg.  We dissected down the tensor fascia lata muscle and the tensor fascia was then divided longitudinally, so we could proceed with a direct anterior approach to the hip.  We identified and cauterized the lateral femoral circumflex vessels and then identified the hip capsule.  I opened up the hip capsule in an L-type format, finding a large joint effusion and significant periarticular osteophytes.  We made our femoral neck cut with an oscillating saw proximal to the lesser trochanter  and completed this with an osteotome.  We placed a corkscrew guide in the femoral head and removed the femoral head in its entirety and found it to be completely devoid of cartilage.  We then cleaned the acetabular remnants of the acetabular labrum and released transverse acetabular ligament.  I placed a bent Hohmann over the medial acetabular rim and then began reaming under direct visualization from a size 42 reamer all the way up to a size 53 with all reamers under direct visualization and the last reamer was under direct fluoroscopy, so we could obtain our depth of reaming, our inclination and anteversion.  Once we were  done with this, we placed the real DePuy Sector Gription acetabular component, size 54 with a single screw and a 36+ 0 neutral polyethylene liner for a size 54 acetabular component.  Attention was then turned to the femur.  With the leg externally rotated to 100 degrees extended and adducted, we were able to release the lateral joint capsule, used a box cutting osteotome to enter the femoral canal and a rongeur to lateralize.  We then began broaching from a size 8 broach up to a size 13 broach.  With the size 13 broach, we trialed a standard offset femoral neck and a 36+ 1.5 hip ball.  We brought the leg back over and up with traction and internal rotation reducing the pelvis.  We were pleased with leg length, stability, offset and range of motion.  We then dislocated the hip and removed the trial components.  We placed the real size 13 Corail femoral component with standard offset and the real 36+ 1.5 ceramic hip ball and reduced this in the acetabulum and again, it was stable.  We then irrigated the soft tissue with normal saline solution using pulsatile lavage.  We closed some remnants of the joint capsule with interrupted #1 Ethibond suture followed by running #1 Vicryl in the tensor fascia, 0 Vicryl in the deep tissue, 2-0 Vicryl in the subcutaneous tissue, 4-0 Monocryl subcuticular stitch and Steri-Strips on the skin.  An Aquacel dressing was then applied.  He was taken off the Hana table and in-and- out catheter was then performed over his bladder and then he was taken to the recovery room in stable condition.  All final counts were correct.  There were no complications noted.  Of note, Richardean Canal, PA- C assisted in the entire case.  His assistance was crucial for facilitating all aspects of this case.     Vanita Panda. Magnus Ivan, M.D.     CYB/MEDQ  D:  05/12/2015  T:  05/13/2015  Job:  [id number]

## 2015-05-13 NOTE — Progress Notes (Signed)
Utilization review completed.  

## 2015-05-13 NOTE — Clinical Social Work Note (Signed)
CSW received referral for SNF.  Case discussed with case manager, and plan is to discharge home with home health.  CSW to sign off please re-consult if social work needs arise.  Jissel Slavens R. Katalina Magri, MSW, LCSWA [phone]  

## 2015-05-13 NOTE — Progress Notes (Signed)
Physical Therapy Progress Note  Clinical impression:  Patient making improvements this pm with gait sequence and increased weight bearing through LLE during gait.  Able to ambulate 30' with RW and min assist.  Slow progress.   05/13/15 1343  PT Visit Information  Last PT Received On 05/13/15  Assistance Needed +1  History of Present Illness Patient is a 55 yo male admitted 05/12/15 now s/p Lt THA - direct anterior approach with no precautions.   PMH:  OA, HTN, DM  PT Time Calculation  PT Start Time (ACUTE ONLY) 1311  PT Stop Time (ACUTE ONLY) 1340  PT Time Calculation (min) (ACUTE ONLY) 29 min  Subjective Data  Subjective "I've been doing my exercises"  Precautions  Precautions None  Restrictions  Weight Bearing Restrictions Yes  LLE Weight Bearing WBAT  Pain Assessment  Pain Assessment 0-10  Pain Score 4  Pain Location Lt hip and thigh  Pain Descriptors / Indicators Burning;Tightness  Pain Intervention(s) Monitored during session;Repositioned  Cognition  Arousal/Alertness Awake/alert  Behavior During Therapy WFL for tasks assessed/performed;Anxious  Overall Cognitive Status Within Functional Limits for tasks assessed  Bed Mobility  Overal bed mobility Needs Assistance  Bed Mobility Sit to Supine  Sit to supine Mod assist  General bed mobility comments Verbal cues for technique.  Assist to bring LE's on to bed to return to supine.  Transfers  Overall transfer level Needs assistance  Equipment used Rolling walker (2 wheeled)  Transfers Sit to/from Stand  Sit to Stand Min assist  General transfer comment Once upright, had patient try to put Lt foot flat on floor.  Ambulation/Gait  Ambulation/Gait assistance Min assist  Ambulation Distance (Feet) 30 Feet  Assistive device Rolling walker (2 wheeled)  Gait Pattern/deviations Step-to pattern;Decreased stance time - left;Decreased step length - right;Decreased step length - left;Decreased stride length;Decreased weight shift to  left;Antalgic;Trunk flexed  General Gait Details Verbal cues for correct gait sequence.  Had patient focus on Lt dorsiflexion during swing phase to clear Lt foot (rather than wiggling foot on floor to advance LLE).  Patient continued to walk on Lt toes - cues to put Lt foot flat on floor.   Patient fatigues quickly.  Increased time for tasks.  Gait velocity decreased  Gait velocity interpretation Below normal speed for age/gender  Exercises  Exercises Total Joint  Total Joint Exercises  Quad Sets AROM;Left;10 reps;Supine  Hip ABduction/ADduction AROM;Both;10 reps;Seated  Long Arc Quad AROM;Left;5 reps;Seated  General Exercises - Lower Extremity  Toe Raises AROM;Both;10 reps;Seated  Heel Raises AROM;Both;10 reps;Seated  Hip Flexion/Marching AROM;Left;10 reps;Seated  PT - End of Session  Equipment Utilized During Treatment Gait belt  Activity Tolerance Patient limited by pain  Patient left in bed;with call bell/phone within reach;with family/visitor present  PT - Assessment/Plan  PT Plan Current plan remains appropriate  PT Frequency (ACUTE ONLY) 7X/week  Follow Up Recommendations Home health PT;Supervision for mobility/OOB  PT equipment Rolling walker with 5" wheels;3in1 (PT)  PT Goal Progression  Progress towards PT goals Progressing toward goals (Making slow progress)  PT General Charges  $$ ACUTE PT VISIT 1 Procedure  PT Treatments  $Gait Training 8-22 mins  $Therapeutic Exercise 8-22 mins  Durenda Hurt. Renaldo Fiddler, [facility] Acute Rehab Services Pager [phone]

## 2015-05-13 NOTE — Progress Notes (Signed)
OT Cancellation Note  Patient Details Name: Madoc Holquin MRN: [id number] DOB: 09/05/1960   Cancelled Treatment:    Reason Eval/Treat Not Completed: Other (comment). Pt just finishing up with PT, will try back later for eval.  Evette Georges [phone] 05/13/2015, 11:33 AM

## 2015-05-13 NOTE — Evaluation (Signed)
Occupational Therapy Evaluation Patient Details Name: Quang Thorpe MRN: [id number] DOB: 05/13/1960 Today's Date: 05/13/2015    History of Present Illness Patient is a 55 yo male admitted 05/12/15 now s/p Lt THA - direct anterior approach with no precautions.   PMH:  OA, HTN, DM   Clinical Impression   This 55 yo male admitted and underwent above presents to acute OT with deficits below. He will benefit from one more session of acute OT to address shower stall transfer to seat and to determine the need for or against a 3n1.    Follow Up Recommendations  No OT follow up    Equipment Recommendations  Other (comment) (3n1 TBD next visit (on 05/14/2015))       Precautions / Restrictions Precautions Precautions: None Restrictions Weight Bearing Restrictions: No LLE Weight Bearing: Weight bearing as tolerated      Mobility Bed Mobility Overal bed mobility: Needs Assistance Bed Mobility: Supine to Sit;Sit to Supine     Supine to sit: Min assist (LLE) Sit to supine: Mod assist (Bil LLEs)      Transfers Overall transfer level: Needs assistance Equipment used: Rolling walker (2 wheeled) Transfers: Sit to/from Stand Sit to Stand: Min guard                   ADL Overall ADL's : Needs assistance/impaired Eating/Feeding: Independent;Sitting   Grooming: Set up;Sitting   Upper Body Bathing: Set up;Sitting   Lower Body Bathing: Maximal assistance (min guard A sit<>stand)   Upper Body Dressing : Set up;Sitting   Lower Body Dressing: Total assistance (min guard A sit<>stand)   Toilet Transfer: Min guard;Ambulation;RW (stand over toilet)   Toileting- Clothing Manipulation and Hygiene: Min guard;Sit to/from stand         General ADL Comments: Wife and pt aware of most efficient technque for wife to A pt with getting dressed               Pertinent Vitals/Pain Pain Assessment: Faces Faces Pain Scale: Hurts little more Pain Location: left hip Pain  Descriptors / Indicators: Aching;Sore;Tightness Pain Intervention(s): Monitored during session;Repositioned     Hand Dominance Right   Extremity/Trunk Assessment Upper Extremity Assessment Upper Extremity Assessment: Overall WFL for tasks assessed           Communication Communication Communication: No difficulties   Cognition Arousal/Alertness: Awake/alert Behavior During Therapy: WFL for tasks assessed/performed Overall Cognitive Status: Within Functional Limits for tasks assessed                                Home Living Family/patient expects to be discharged to:: Private residence Living Arrangements: Spouse/significant other Available Help at Discharge: Family;Available 24 hours/day Type of Home: House Home Access: Stairs to enter [employer] of Steps: 2 Entrance Stairs-Rails: None Home Layout: One level;Laundry or work area in [employer] Shower/Tub: Tub/shower unit;Door Shower/tub characteristics: Sport and exercise psychologist: Standard     Home Equipment: Crutches;Shower seat - built in          Prior Functioning/Environment Level of Independence: Independent        Comments: Works - very active job    OT Diagnosis: Generalized weakness;Acute pain   OT Problem List: Decreased range of motion;Impaired balance (sitting and/or standing);Pain;Impaired tone (LLE)   OT Treatment/Interventions: Self-care/ADL training;Patient/family education;Balance training;Therapeutic activities;DME and/or AE instruction    OT Goals(Current goals can be found in the care plan  section) Acute Rehab OT Goals Patient Stated Goal: home maybe tomorrow OT Goal Formulation: With patient/family Time For Goal Achievement: 05/20/15 Potential to Achieve Goals: Good  OT Frequency: Min 2X/week              End of Session Equipment Utilized During Treatment: Rolling walker  Activity Tolerance: Patient tolerated treatment well Patient left: in  bed;with call bell/phone within reach;with family/visitor present (OK'd wife to help pt up to side of bed, but they need to call staff in he plans to stand up)   Time: 1411-1452 OT Time Calculation (min): 41 min Charges:  OT General Charges $OT Visit: 1 Procedure OT Evaluation $OT Eval Moderate Complexity: 1 Procedure OT Treatments $Self Care/Home Management : 23-37 mins  Evette Georges [phone] 05/13/2015, 4:04 PM

## 2015-05-13 NOTE — Progress Notes (Signed)
Subjective: 1 Day Post-Op Procedure(s) (LRB): LEFT TOTAL HIP ARTHROPLASTY ANTERIOR APPROACH (Left) Patient reports pain as moderate.    Objective: Vital signs in last 24 hours: Temp:  [97.7 F (36.5 C)-99.9 F (37.7 C)] 98.9 F (37.2 C) (02/10 0417) Pulse Rate:  [44-91] 74 (02/10 0417) Resp:  [10-20] 16 (02/10 0417) BP: (106-128)/(74-89) 127/76 mmHg (02/10 0417) SpO2:  [96 %-100 %] 96 % (02/10 0417) Weight:  [74.39 kg (164 lb)] 74.39 kg (164 lb) (02/09 0847)  Intake/Output from previous day: 02/09 0701 - 02/10 0700 In: 1580 [P.O.:480; I.V.:1100] Out: 1150 [Urine:1000; Blood:150] Intake/Output this shift: Total I/O In: -  Out: 800 [Urine:800]  No results for input(s): HGB in the last 72 hours. No results for input(s): WBC, RBC, HCT, PLT in the last 72 hours. No results for input(s): NA, K, CL, CO2, BUN, CREATININE, GLUCOSE, CALCIUM in the last 72 hours. No results for input(s): LABPT, INR in the last 72 hours.  Sensation intact distally Intact pulses distally Dorsiflexion/Plantar flexion intact Incision: scant drainage  Assessment/Plan: 1 Day Post-Op Procedure(s) (LRB): LEFT TOTAL HIP ARTHROPLASTY ANTERIOR APPROACH (Left) Up with therapy Plan for discharge tomorrow Discharge home with home health  Kathryne Hitch 05/13/2015, 6:51 AM

## 2015-05-13 NOTE — Progress Notes (Signed)
Inpatient Diabetes Program Recommendations  AACE/ADA: New Consensus Statement on Inpatient Glycemic Control (2015)  Target Ranges:  Prepandial:   less than 140 mg/dL      Peak postprandial:   less than 180 mg/dL (1-2 hours)      Critically ill patients:  140 - 180 mg/dL   Review of Glycemic Control:  Results for Glenn Stevenson, Glenn Stevenson (MRN [id number]) as of 05/13/2015 16:18  Ref. Range 05/12/2015 14:44 05/12/2015 16:25 05/12/2015 21:35 05/13/2015 06:43 05/13/2015 12:16  Glucose-Capillary Latest Ref Range: 65-99 mg/dL 409 (H) 811 (H) 914 (H) 180 (H) 318 (H)  Results for [name], [name] (MRN [id number]) as of 05/13/2015 16:18  Ref. Range 05/02/2015 09:21  Hemoglobin A1C Latest Ref Range: 4.8-5.6 % 7.0 (H)    Diabetes history:  Type 2 diabetes Outpatient Diabetes medications: Janumet 50-1000 bid Current orders for Inpatient glycemic control:  Novolog moderate tid with meals and HS, Januvia 50 mg bid, Metformin 1000 mg bid  Inpatient Diabetes Program Recommendations:   Note that CBG's increased.  Patient had been refusing insulin  In the hospital however blood sugar increased at lunch.  Patient states that he had lots of pain this morning which likely increased blood sugars.  Discussed goal blood sugars after surgery and the importance of monitoring closely and alerting primary MD regarding elevated CBG's greater than 180 mg/dL.  A1C indicates well controlled diabetes prior to hospitalization.    Thanks, Beryl Meager, RN, BC-ADM Inpatient Diabetes Coordinator Pager [phone] (8a-5p)

## 2015-05-13 NOTE — Progress Notes (Signed)
Physical Therapy Progress Note  Clinical impression:  Pain limiting patient's progress with mobility.  Slow progress with mobility and gait.   05/13/15 1146  PT Visit Information  Last PT Received On 05/13/15  Assistance Needed +1  History of Present Illness Patient is a 55 yo male admitted 05/12/15 now s/p Lt THA - direct anterior approach with no precautions.   PMH:  OA, HTN, DM  PT Time Calculation  PT Start Time (ACUTE ONLY) 1113  PT Stop Time (ACUTE ONLY) 1143  PT Time Calculation (min) (ACUTE ONLY) 30 min  Subjective Data  Subjective "I'm worse today than yesterday"  Precautions  Precautions None  Restrictions  Weight Bearing Restrictions Yes  LLE Weight Bearing WBAT  Pain Assessment  Pain Assessment 0-10  Pain Score 4  Pain Location Lt hip and thigh  Pain Descriptors / Indicators Aching;Burning;Tightness  Pain Intervention(s) Monitored during session;Premedicated before session;Repositioned;Patient requesting pain meds-RN notified  Cognition  Arousal/Alertness Awake/alert  Behavior During Therapy WFL for tasks assessed/performed;Anxious  Overall Cognitive Status Within Functional Limits for tasks assessed  Bed Mobility  Overal bed mobility Needs Assistance  Bed Mobility Supine to Sit  Supine to sit Min assist  General bed mobility comments Assist to bring LLE off of bed.  Noted patient pulling LLE into flexed position.  Sat EOB x 5 minutes to work on relaxation of LLE.  Transfers  Overall transfer level Needs assistance  Equipment used Rolling walker (2 wheeled)  Transfers Sit to/from Stand  Sit to Stand Min assist  General transfer comment Verbal cues for hand placement.  Had patient concentrate on standing on RLE to allow LLE to relax into extension.  Increased time for transfer  Ambulation/Gait  Ambulation/Gait assistance Min assist  Ambulation Distance (Feet) 10 Feet  Assistive device Rolling walker (2 wheeled)  Gait Pattern/deviations Step-to pattern;Decreased  stance time - left;Decreased step length - right;Decreased step length - left;Decreased stride length;Decreased weight shift to left;Antalgic;Trunk flexed  General Gait Details Verbal cues for safe use of RW.  Patient using TDWB on LLE, with LLE held in flexed position.  Patient reports pain with weightbearing.  Was able to ambulate 10' with min assist.  Difficulty advancing LLE.  Gait velocity decreased  Gait velocity interpretation Below normal speed for age/gender  Exercises  Exercises Total Joint  Total Joint Exercises  Ankle Circles/Pumps AROM;Both;10 reps;Seated  Quad Sets AROM;Left;10 reps;Seated  Long Arc Glenn Stevenson;Left;5 reps;Seated;Limitations  Long [employer] Limitations Difficulty initiating knee extension.  PT - End of Session  Equipment Utilized During Treatment Gait belt  Activity Tolerance Patient limited by pain  Patient left in chair;with call bell/phone within reach;with family/visitor present  Nurse Communication Mobility status;Patient requests pain meds (Pain limiting mobility)  PT - Assessment/Plan  PT Plan Current plan remains appropriate  PT Frequency (ACUTE ONLY) 7X/week  Follow Up Recommendations Home health PT;Supervision for mobility/OOB  PT equipment Rolling walker with 5" wheels;3in1 (PT)  PT Goal Progression  Progress towards PT goals Progressing toward goals  PT General Charges  $$ ACUTE PT VISIT 1 Procedure  PT Treatments  $Gait Training 8-22 mins  $Therapeutic Exercise 8-22 mins  Glenn Stevenson. Renaldo Fiddler, [facility] Acute Rehab Services Pager [phone]

## 2015-05-14 LAB — CBC
HEMATOCRIT: 42.7 % (ref 39.0–52.0)
Hemoglobin: 15.1 g/dL (ref 13.0–17.0)
MCH: 31.2 pg (ref 26.0–34.0)
MCHC: 35.4 g/dL (ref 30.0–36.0)
MCV: 88.2 fL (ref 78.0–100.0)
PLATELETS: 183 10*3/uL (ref 150–400)
RBC: 4.84 MIL/uL (ref 4.22–5.81)
RDW: 12.9 % (ref 11.5–15.5)
WBC: 13.9 10*3/uL — AB (ref 4.0–10.5)

## 2015-05-14 LAB — GLUCOSE, CAPILLARY
GLUCOSE-CAPILLARY: 256 mg/dL — AB (ref 65–99)
Glucose-Capillary: 136 mg/dL — ABNORMAL HIGH (ref 65–99)

## 2015-05-14 NOTE — Progress Notes (Signed)
Occupational Therapy Treatment Patient Details Name: Glenn Stevenson MRN: [id number] DOB: 1960-07-09 Today's Date: 05/14/2015    History of present illness Patient is a 55 yo male admitted 05/12/15 now s/p Lt THA - direct anterior approach with no precautions.   PMH:  OA, HTN, DM   OT comments  Pt making good progress toward OT goals. Educated pt and wife on walk in shower technique and provided handout; pt able to return demo technique with wife assisting as needed. Pt ready to d/c from OT standpoint but will continue to follow acutely.    Follow Up Recommendations  No OT follow up    Equipment Recommendations  None recommended by OT    Recommendations for Other Services      Precautions / Restrictions Precautions Precautions: None Restrictions Weight Bearing Restrictions: Yes LLE Weight Bearing: Weight bearing as tolerated       Mobility Bed Mobility Overal bed mobility: Needs Assistance Bed Mobility: Supine to Sit;Sit to Supine     Supine to sit: Min guard;HOB elevated Sit to supine: Min assist;HOB elevated   General bed mobility comments: Wife able to demo assisting with LEs back into bed as needed.  Transfers Overall transfer level: Needs assistance Equipment used: Rolling walker (2 wheeled) Transfers: Sit to/from Stand Sit to Stand: Supervision         General transfer comment: Supervision for safety. Good hand placement and technique with sit to stand from EOB x1.    Balance Overall balance assessment: Needs assistance Sitting-balance support: No upper extremity supported;Feet supported Sitting balance-Leahy Scale: Good     Standing balance support: Bilateral upper extremity supported;During functional activity Standing balance-Leahy Scale: Poor Standing balance comment: RW for support                   ADL Overall ADL's : Needs assistance/impaired                                 Tub/ Shower Transfer: Min  guard;Ambulation;Shower Dealer Details (indicate cue type and reason): Educated pt and wife on walk in shower transfer technique; pt able to return demo with min guard assist for safety. Wife verbalizes understanding of how she can assist with shower transfer. Provided handout. Functional mobility during ADLs: Min guard;Rolling walker General ADL Comments: Wife present for OT session. Educated on use of sheet or towel as a leg lifter to assist with bed mobility; pt able to return demo use of sheet to assist with managing LLE in and out of bed.      Vision                     Perception     Praxis      Cognition   Behavior During Therapy: [facility] for tasks assessed/performed;Anxious Overall Cognitive Status: Within Functional Limits for tasks assessed                       Extremity/Trunk Assessment               Exercises   Shoulder Instructions       General Comments      Pertinent Vitals/ Pain       Pain Assessment: Faces Faces Pain Scale: Hurts even more Pain Location: L hip Pain Descriptors / Indicators: Aching Pain Intervention(s): Limited activity within patient's tolerance;Monitored during session  Home Living  Prior Functioning/Environment              Frequency Min 2X/week     Progress Toward Goals  OT Goals(current goals can now be found in the care plan section)  Progress towards OT goals: Progressing toward goals  Acute Rehab OT Goals Patient Stated Goal: home today OT Goal Formulation: With patient/family  Plan Equipment recommendations need to be updated    Co-evaluation                 End of Session Equipment Utilized During Treatment: Gait belt;Rolling walker   Activity Tolerance Patient tolerated treatment well   Patient Left in bed;with call bell/phone within reach;with family/visitor present;with nursing/sitter in  room   Nurse Communication Other (comment) (pt ready to d/c from OT standpoint)        Time: 1210-1228 OT Time Calculation (min): 18 min  Charges: OT General Charges $OT Visit: 1 Procedure OT Treatments $Self Care/Home Management : 8-22 mins  Gaye Alken M.S., OTR/L Pager: [phone]  05/14/2015, 1:03 PM

## 2015-05-14 NOTE — Progress Notes (Signed)
Physical Therapy Treatment Patient Details Name: Glenn Stevenson MRN: [id number] DOB: 1960/11/26 Today's Date: 05/14/2015    History of Present Illness Patient is a 55 yo male admitted 05/12/15 now s/p Lt THA - direct anterior approach with no precautions.   PMH:  OA, HTN, DM    PT Comments    Glenn Stevenson made excellent progress this session.  He was able to transition from TDWB Lt LE to WBAT, demonstrating heel strike.  Stair training was completed w/ assist of wife.  Pt is appropriate for d/c from a mobility standpoint.    Follow Up Recommendations  Home health PT;Supervision for mobility/OOB     Equipment Recommendations  Rolling walker with 5" wheels;3in1 (PT)    Recommendations for Other Services       Precautions / Restrictions Precautions Precautions: None Restrictions Weight Bearing Restrictions: Yes LLE Weight Bearing: Weight bearing as tolerated    Mobility  Bed Mobility Overal bed mobility: Needs Assistance Bed Mobility: Sit to Supine       Sit to supine: Min assist   General bed mobility comments: Wife assists pt by managing Bil LEs.  Cues provided to pt and wife for correct technique.  Transfers Overall transfer level: Needs assistance Equipment used: Rolling walker (2 wheeled) Transfers: Sit to/from Stand Sit to Stand: Supervision         General transfer comment: Pt slow to stand and requires verbal cues to stand upright.  Unable to place Lt foot initially but this improvese w/ ambulation.  Ambulation/Gait Ambulation/Gait assistance: Supervision Ambulation Distance (Feet): 120 Feet Assistive device: Rolling walker (2 wheeled) Gait Pattern/deviations: Step-to pattern;Decreased stride length;Decreased weight shift to left;Antalgic;Trunk flexed   Gait velocity interpretation: Below normal speed for age/gender General Gait Details: Cues for upright posture.  Pt transitions from TDWB on Lt LE to WBAT.  Pt responded well to cues to perform heel  strike.   Stairs Stairs: Yes Stairs assistance: Min assist Stair Management: No rails;Backwards;With walker Number of Stairs: 3 General stair comments: Assist provided by PT to hold RW when ascending, assist from wife when descending.  Pt requires increased time and cues provided for correct sequencing.  Wheelchair Mobility    Modified Rankin (Stroke Patients Only)       Balance Overall balance assessment: Needs assistance Sitting-balance support: No upper extremity supported;Feet supported Sitting balance-Leahy Scale: Good     Standing balance support: Bilateral upper extremity supported;During functional activity Standing balance-Leahy Scale: Poor Standing balance comment: Relies on RW for support                    Cognition Arousal/Alertness: Awake/alert Behavior During Therapy: WFL for tasks assessed/performed;Anxious Overall Cognitive Status: Within Functional Limits for tasks assessed                      Exercises Total Joint Exercises Ankle Circles/Pumps: AROM;Both;Seated;10 reps Quad Sets: 10 reps;Supine;AROM;Both Hip ABduction/ADduction: AROM;10 reps;Left;Supine    General Comments        Pertinent Vitals/Pain Pain Assessment: Faces Faces Pain Scale: Hurts even more Pain Location: Lt hip w/ mobility Pain Descriptors / Indicators: Aching;Grimacing;Discomfort Pain Intervention(s): Limited activity within patient's tolerance;Monitored during session;Repositioned    Home Living                      Prior Function            PT Goals (current goals can now be found in the care plan section) Acute  Rehab PT Goals Patient Stated Goal: home today PT Goal Formulation: With patient/family Time For Goal Achievement: 05/19/15 Potential to Achieve Goals: Good Progress towards PT goals: Progressing toward goals    Frequency  7X/week    PT Plan Current plan remains appropriate    Co-evaluation             End of Session  Equipment Utilized During Treatment: Gait belt Activity Tolerance: Patient tolerated treatment well Patient left: with call bell/phone within reach;with family/visitor present;in bed;with bed alarm set     Time: 1131-1200 PT Time Calculation (min) (ACUTE ONLY): 29 min  Charges:  $Gait Training: 23-37 mins                    G Codes:      Michail Jewels PT, [place] [phone] Pager: [phone] 05/14/2015, 12:16 PM

## 2015-05-14 NOTE — Care Management Note (Signed)
Case Management Note  Patient Details  Name: Glenn Stevenson MRN: [id number] Date of Birth: 1960-09-23  Subjective/Objective:                    Action/Plan: Patient is insured through [employer] which requires that CM contacts [facility] with requests for Home Health and DME. . Case manager called [facility] and spoke with Annice Pih. Patient's ID # [id number] for  this request.  Case manager asked that they contact patient when they assign a home health agency. Case manager will inform patient and wife of this. Rolling walker and 3in1 will come from [facility] per [facility]. She will be faxing authorization to Case Manager for [facility] DME liaison.  Expected Discharge Date:    05/14/15             Expected Discharge Plan:   Home with [facility]  In-House Referral:     Discharge planning Services   HHPT  Post Acute Care Choice:    Choice offered to:     DME Arranged:   RW/3in1 DME Agency:   [facility]  HH Arranged:   PT HH Agency:   to be determined  Status of Service:   completed  Medicare Important Message Given:    Date Medicare IM Given:    Medicare IM give by:    Date Additional Medicare IM Given:    Additional Medicare Important Message give by:     If discussed at Long Length of Stay Meetings, dates discussed:    Additional Comments:  Durenda Guthrie, RN 05/14/2015, 10:05 AM

## 2015-05-14 NOTE — Discharge Summary (Signed)
Patient ID: Glenn Stevenson MRN: [id number] DOB/AGE: February 29, 1961 55 y.o.  Admit date: 05/12/2015 Discharge date: 05/14/2015  Admission Diagnoses:  Principal Problem:   Osteoarthritis of left hip Active Problems:   Status post total replacement of left hip   Discharge Diagnoses:  Same  Past Medical History  Diagnosis Date  . Hypertension   . Diabetes mellitus without complication (HCC)   . History of kidney stones   . Arthritis     Surgeries: Procedure(s): LEFT TOTAL HIP ARTHROPLASTY ANTERIOR APPROACH on 05/12/2015   Consultants:    Discharged Condition: Improved  Hospital Course: Burdell Peed is an 55 y.o. male who was admitted 05/12/2015 for operative treatment ofOsteoarthritis of left hip. Patient has severe unremitting pain that affects sleep, daily activities, and work/hobbies. After pre-op clearance the patient was taken to the operating room on 05/12/2015 and underwent  Procedure(s): LEFT TOTAL HIP ARTHROPLASTY ANTERIOR APPROACH.    Patient was given perioperative antibiotics: Anti-infectives    Start     Dose/Rate Route Frequency Ordered Stop   05/12/15 1600  ceFAZolin (ANCEF) IVPB 1 g/50 mL premix     1 g 100 mL/hr over 30 Minutes Intravenous Every 6 hours 05/12/15 1428 05/12/15 2230   05/12/15 0600  ceFAZolin (ANCEF) IVPB 2 g/50 mL premix     2 g 100 mL/hr over 30 Minutes Intravenous On call to O.R. 05/11/15 1324 05/12/15 0958       Patient was given sequential compression devices, early ambulation, and chemoprophylaxis to prevent DVT.  Patient benefited maximally from hospital stay and there were no complications.    Recent vital signs: Patient Vitals for the past 24 hrs:  BP Temp Temp src Pulse Resp SpO2  05/14/15 0557 128/82 mmHg 98 F (36.7 C) Oral 84 16 100 %  05/13/15 2155 (!) 121/91 mmHg 99.5 F (37.5 C) Oral (!) 104 16 97 %  05/13/15 1100 (!) 148/88 mmHg 98.4 F (36.9 C) Oral 94 16 97 %     Recent laboratory studies:  Recent Labs   05/13/15 0611  WBC 9.3  HGB 13.8  HCT 37.6*  PLT 150  NA 139  K 4.3  CL 106  CO2 27  BUN 15  CREATININE 0.97  GLUCOSE 199*  CALCIUM 8.7*     Discharge Medications:     Medication List    TAKE these medications        aspirin 325 MG EC tablet  Take 1 tablet (325 mg total) by mouth 2 (two) times daily after a meal.     cholecalciferol 1000 units tablet  Commonly known as:  VITAMIN D  Take 1,000 Units by mouth daily.     Fish Oil 1000 MG Caps  Take 1,000 mg by mouth 2 (two) times daily.     glipiZIDE 5 MG 24 hr tablet  Commonly known as:  GLUCOTROL XL  Take 5 mg by mouth daily.     JANUMET 50-1000 MG tablet  Generic drug:  sitaGLIPtin-metformin  Take 1 tablet by mouth 2 (two) times daily with a meal.     lisinopril-hydrochlorothiazide 20-12.5 MG tablet  Commonly known as:  PRINZIDE,ZESTORETIC  Take 1 tablet by mouth daily.     methocarbamol 500 MG tablet  Commonly known as:  ROBAXIN  Take 1 tablet (500 mg total) by mouth every 6 (six) hours as needed for muscle spasms.     oxyCODONE-acetaminophen 5-325 MG tablet  Commonly known as:  ROXICET  Take 1-2 tablets by mouth every 4 (four) hours  as needed.     potassium chloride SA 20 MEQ tablet  Commonly known as:  K-DUR,KLOR-CON  Take 20 mEq by mouth 2 (two) times daily.        Diagnostic Studies: Dg Hip Port Unilat With Pelvis 1v Left  05/12/2015  CLINICAL DATA:  Anterior hip replacement, postoperative. EXAM: DG HIP (WITH OR WITHOUT PELVIS) 1V PORT LEFT COMPARISON:  05/12/2015 intraoperative radiographs FINDINGS: Left total hip prosthesis observed, screw fixation of acetabular shell component, excellent alignment and positioning, no periprosthetic fracture or early complicating feature identified. IMPRESSION: 1. Left total hip prosthesis in place without periprosthetic fracture or acute complicating feature. Electronically Signed   By: Gaylyn Rong M.D.   On: 05/12/2015 14:03   Dg Hip Operative Unilat W Or  W/o Pelvis Left  05/12/2015  CLINICAL DATA:  Total left hip arthroplasty. EXAM: OPERATIVE LEFT HIP (WITH PELVIS IF PERFORMED) FLUOROSCOPIC VIEWS TECHNIQUE: Fluoroscopic spot image(s) were submitted for interpretation post-operatively. COMPARISON:  None. FINDINGS: Two fluoroscopic images from left total hip arthroplasty demonstrates screwed in acetabular component and long stem femoral component hip prosthesis with satisfactory alignment. There is no evidence of fracture. Expected soft tissue emphysema is noted. IMPRESSION: Status post total left hip arthroplasty without evidence of immediate complications. Electronically Signed   By: Ted Mcalpine M.D.   On: 05/12/2015 11:36    Disposition: to home      Discharge Instructions    Call MD / Call 911    Complete by:  As directed   If you experience chest pain or shortness of breath, CALL 911 and be transported to the hospital emergency room.  If you develope a fever above 101.5 F, pus (white drainage) or increased drainage or redness at the wound, or calf pain, call your surgeon's office.     Constipation Prevention    Complete by:  As directed   Drink plenty of fluids.  Prune juice may be helpful.  You may use a stool softener, such as Colace (over the counter) 100 mg twice a day.  Use MiraLax (over the counter) for constipation as needed.     Diet - low sodium heart healthy    Complete by:  As directed      Diet general    Complete by:  As directed      Driving restrictions    Complete by:  As directed   No driving while taking narcotic pain meds.     Increase activity slowly as tolerated    Complete by:  As directed            Follow-up Information    Follow up with Kathryne Hitch, MD In 2 weeks.   Specialty:  Orthopedic Surgery   Contact information:   [address] [place] [address] [phone]        Signed: Kathryne Hitch 05/14/2015, 9:23 AM

## 2015-05-14 NOTE — Progress Notes (Signed)
NAD, no events, moving around much better today. VSSAF Dressing c/d/i Ready for dc today Rx in chart  N. Glee Arvin, MD [facility] [phone] 8:30 AM

## 2016-05-02 ENCOUNTER — Ambulatory Visit (INDEPENDENT_AMBULATORY_CARE_PROVIDER_SITE_OTHER): Payer: Self-pay

## 2016-05-02 ENCOUNTER — Ambulatory Visit (INDEPENDENT_AMBULATORY_CARE_PROVIDER_SITE_OTHER): Payer: Managed Care, Other (non HMO) | Admitting: Orthopaedic Surgery

## 2016-05-02 DIAGNOSIS — Z96642 Presence of left artificial hip joint: Secondary | ICD-10-CM | POA: Diagnosis not present

## 2016-05-02 DIAGNOSIS — M1612 Unilateral primary osteoarthritis, left hip: Secondary | ICD-10-CM

## 2016-05-02 NOTE — Progress Notes (Signed)
Patient is very pleasant 56 year old gentleman who is now 1 year out from a left total hip arthroplasty through direct injury approach. He has no pain is doing well he says range of motion strength are great he denies a nubs and tingling in his is back to full activities daily pain at all.  On examination I can easily put his right hip and left hip through internal rotation rotation without any difficulties or pain. His leg lengths are equal. A low AP pelvis and lateral of his left operative hip shows a well-seated implant. The loosening or, getting features. There is no acute bony irregularities.  At this point as far as his hip goes he'll follow-up as needed. I talked about things and would need to bring him back to us for the hip if these have any issues but he no see we can see him for anything else as well.

## 2017-06-22 IMAGING — CR DG HIP (WITH OR WITHOUT PELVIS) 1V PORT*L*
2 series · 2 of 2 positions shown · non-contrast
Comparison: 05/12/2015 intraoperative radiographs

CLINICAL DATA: Anterior hip replacement, postoperative.

EXAM:
DG HIP (WITH OR WITHOUT PELVIS) 1V PORT LEFT

[towne view]
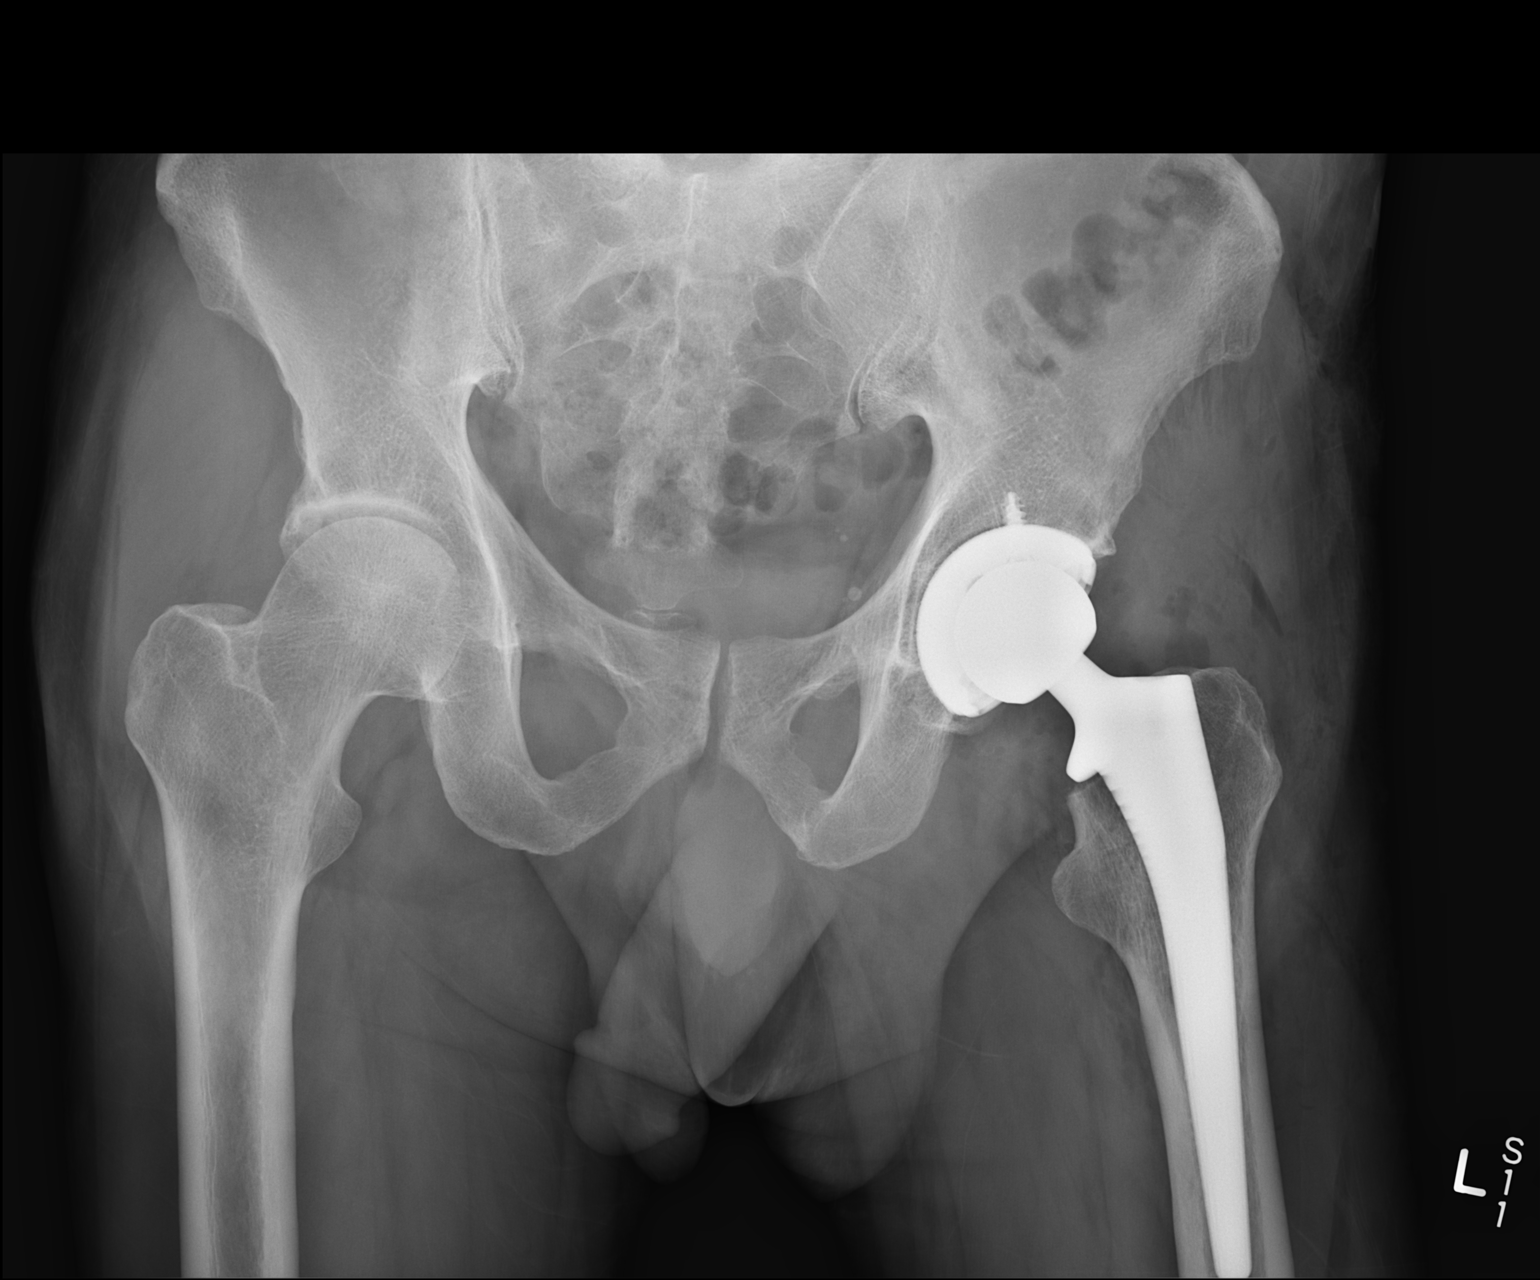

[AP]
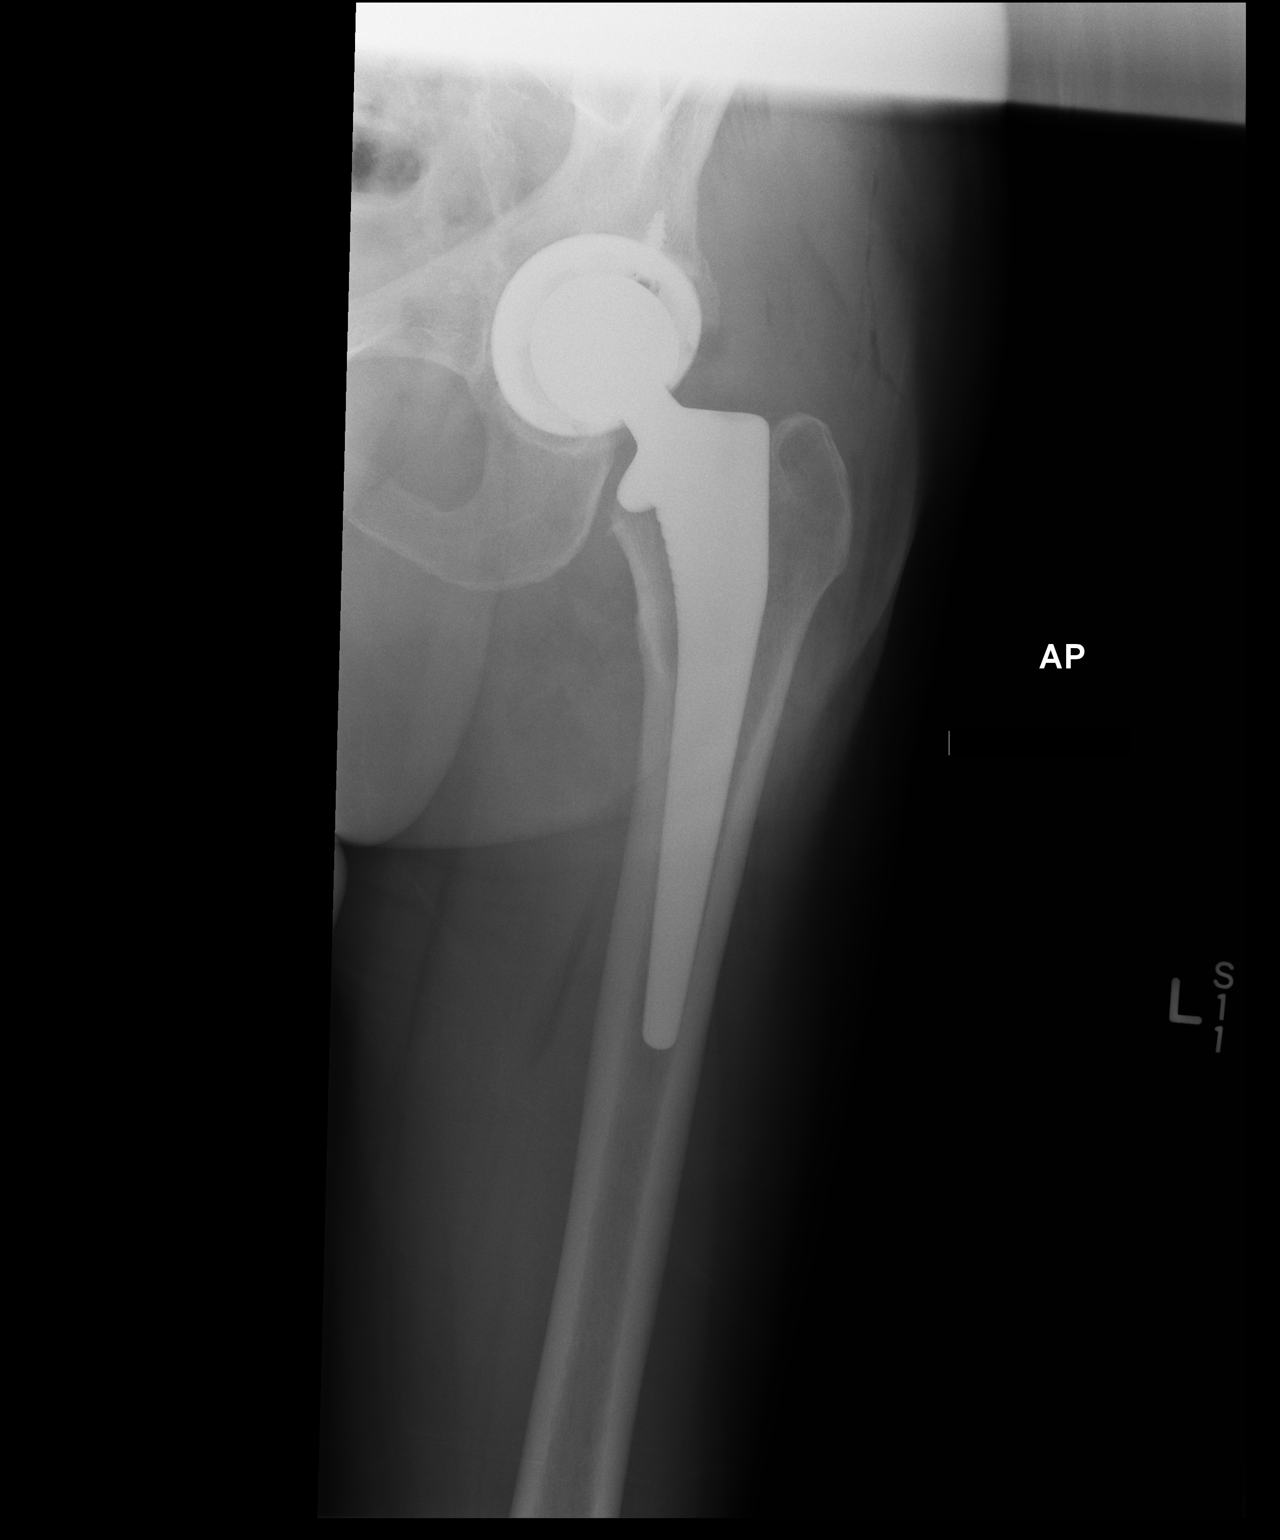

[2 of 2 positions shown; findings below may reference images not displayed]

FINDINGS: Left total hip prosthesis observed, screw fixation of acetabular
shell component, excellent alignment and positioning, no
periprosthetic fracture or early complicating feature identified.
IMPRESSION: 1. Left total hip prosthesis in place without periprosthetic
fracture or acute complicating feature.

## 2020-01-06 ENCOUNTER — Telehealth: Payer: Self-pay

## 2020-01-06 NOTE — Telephone Encounter (Signed)
Dr. Reece Packer [facility] office would like pre medication letter/note faxed to [phone].  Cb# [phone].  Please advise.  Thank you.

## 2020-01-06 NOTE — Telephone Encounter (Signed)
Faxed to provided number  

## 2022-03-20 DIAGNOSIS — E559 Vitamin D deficiency, unspecified: Secondary | ICD-10-CM | POA: Diagnosis not present

## 2022-03-20 DIAGNOSIS — Z0000 Encounter for general adult medical examination without abnormal findings: Secondary | ICD-10-CM | POA: Diagnosis not present

## 2022-03-20 DIAGNOSIS — E785 Hyperlipidemia, unspecified: Secondary | ICD-10-CM | POA: Diagnosis not present

## 2022-03-20 DIAGNOSIS — Z125 Encounter for screening for malignant neoplasm of prostate: Secondary | ICD-10-CM | POA: Diagnosis not present

## 2022-03-20 DIAGNOSIS — Z23 Encounter for immunization: Secondary | ICD-10-CM | POA: Diagnosis not present

## 2022-03-20 DIAGNOSIS — Z86010 Personal history of colonic polyps: Secondary | ICD-10-CM | POA: Diagnosis not present

## 2022-03-20 DIAGNOSIS — E1169 Type 2 diabetes mellitus with other specified complication: Secondary | ICD-10-CM | POA: Diagnosis not present

## 2022-03-23 DIAGNOSIS — E119 Type 2 diabetes mellitus without complications: Secondary | ICD-10-CM | POA: Diagnosis not present

## 2022-09-19 DIAGNOSIS — I10 Essential (primary) hypertension: Secondary | ICD-10-CM | POA: Diagnosis not present

## 2022-09-19 DIAGNOSIS — E1169 Type 2 diabetes mellitus with other specified complication: Secondary | ICD-10-CM | POA: Diagnosis not present

## 2023-04-02 DIAGNOSIS — E119 Type 2 diabetes mellitus without complications: Secondary | ICD-10-CM | POA: Diagnosis not present

## 2023-04-10 DIAGNOSIS — Z0000 Encounter for general adult medical examination without abnormal findings: Secondary | ICD-10-CM | POA: Diagnosis not present

## 2023-04-10 DIAGNOSIS — Z125 Encounter for screening for malignant neoplasm of prostate: Secondary | ICD-10-CM | POA: Diagnosis not present

## 2023-04-10 DIAGNOSIS — E1169 Type 2 diabetes mellitus with other specified complication: Secondary | ICD-10-CM | POA: Diagnosis not present

## 2023-04-10 DIAGNOSIS — I10 Essential (primary) hypertension: Secondary | ICD-10-CM | POA: Diagnosis not present

## 2023-11-25 DIAGNOSIS — E1165 Type 2 diabetes mellitus with hyperglycemia: Secondary | ICD-10-CM | POA: Diagnosis not present

## 2023-11-27 DIAGNOSIS — E1165 Type 2 diabetes mellitus with hyperglycemia: Secondary | ICD-10-CM | POA: Diagnosis not present

## 2023-12-24 DIAGNOSIS — E1165 Type 2 diabetes mellitus with hyperglycemia: Secondary | ICD-10-CM | POA: Diagnosis not present

## 2023-12-24 DIAGNOSIS — E785 Hyperlipidemia, unspecified: Secondary | ICD-10-CM | POA: Diagnosis not present

## 2023-12-24 DIAGNOSIS — I10 Essential (primary) hypertension: Secondary | ICD-10-CM | POA: Diagnosis not present
